# Patient Record
Sex: Female | Born: 2005 | Race: White | Hispanic: No | Marital: Single | Smoking: Never smoker
Health system: Southern US, Community
[De-identification: ages and names within clinical notes are randomized; demographics above are authoritative.]

## PROBLEM LIST (undated history)

## (undated) ENCOUNTER — Inpatient Hospital Stay (HOSPITAL_COMMUNITY): Payer: Self-pay

## (undated) ENCOUNTER — Emergency Department (HOSPITAL_COMMUNITY): Admission: EM

## (undated) DIAGNOSIS — H6690 Otitis media, unspecified, unspecified ear: Secondary | ICD-10-CM

## (undated) DIAGNOSIS — J309 Allergic rhinitis, unspecified: Secondary | ICD-10-CM

## (undated) DIAGNOSIS — L509 Urticaria, unspecified: Secondary | ICD-10-CM

## (undated) DIAGNOSIS — T783XXA Angioneurotic edema, initial encounter: Secondary | ICD-10-CM

## (undated) DIAGNOSIS — J45909 Unspecified asthma, uncomplicated: Secondary | ICD-10-CM

## (undated) HISTORY — DX: Angioneurotic edema, initial encounter: T78.3XXA

## (undated) HISTORY — DX: Urticaria, unspecified: L50.9

## (undated) HISTORY — DX: Allergic rhinitis, unspecified: J30.9

## (undated) HISTORY — DX: Unspecified asthma, uncomplicated: J45.909

## (undated) HISTORY — PX: TYMPANOSTOMY TUBE PLACEMENT: SHX32

## (undated) HISTORY — DX: Otitis media, unspecified, unspecified ear: H66.90

---
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
## 2024-01-10 ENCOUNTER — Ambulatory Visit (INDEPENDENT_AMBULATORY_CARE_PROVIDER_SITE_OTHER): Admitting: *Deleted

## 2024-01-10 DIAGNOSIS — J309 Allergic rhinitis, unspecified: Secondary | ICD-10-CM

## 2024-01-12 ENCOUNTER — Ambulatory Visit (INDEPENDENT_AMBULATORY_CARE_PROVIDER_SITE_OTHER): Admitting: Allergy and Immunology

## 2024-01-12 ENCOUNTER — Encounter: Payer: Self-pay | Admitting: Allergy and Immunology

## 2024-01-12 VITALS — BP 110/80 | HR 68 | Resp 20 | Ht 61.0 in | Wt 103.2 lb

## 2024-01-12 DIAGNOSIS — J3089 Other allergic rhinitis: Secondary | ICD-10-CM | POA: Diagnosis not present

## 2024-01-12 DIAGNOSIS — J4520 Mild intermittent asthma, uncomplicated: Secondary | ICD-10-CM | POA: Diagnosis not present

## 2024-01-12 MED ORDER — AIRSUPRA 90-80 MCG/ACT IN AERO: 2.0000 | INHALATION_SPRAY | RESPIRATORY_TRACT | 1 refills | Status: AC | PRN

## 2024-01-12 NOTE — Progress Notes (Unsigned)
 [place] - [place] - [place] - [place] - [place]   Follow-up Note  Referring Provider: Shlomo Clunes, NP Primary Provider: Shlomo Clunes, NP Date of Office Visit: 01/12/2024  Subjective:   Yvonne Richardson (DOB: 2006-02-12) is a 18 y.o. female who [facility] in re-evaluation of the following:  HPI: Erynne returns to this clinic in evaluation of asthma and allergic rhinoconjunctivitis.  I last saw her in this clinic 05 November 2022.  She is completing 5 years of immunotherapy and she has really had a good response and she went through the spring with no difficulty at all and did not need to use any antihistamines and her asthma has been nonexistent.  She can exercise without any difficulty.  She can have cold air exposure without any difficulty.  She never uses a short acting bronchodilator.  She has not required a systemic steroid or antibiotic for any type of airway issue.  Allergies as of 01/12/2024       Reactions   Latex Itching, Rash   Penicillins Rash   Has patient had a PCN reaction causing immediate rash, facial/tongue/throat swelling, SOB or lightheadedness with hypotension: Yes Has patient had a PCN reaction causing severe rash involving mucus membranes or skin necrosis: No Has patient had a PCN reaction that required hospitalization: No; was in hosp Has patient had a PCN reaction occurring within the last 10 years: No If all of the above answers are NO, then may proceed with Cephalosporin use        Medication List    EPINEPHrine  0.3 mg/0.3 mL Soaj injection Commonly known as: EPI-PEN Use as directed for life-threatening allergic reaction.   rizatriptan  10 MG disintegrating tablet Commonly known as: MAXALT -MLT TAKE 1 TABLET BY MOUTH AS NEEDED FOR MIGRAINE (TAKE 1 TABLET AT THE HEADACHE ONSET, YOU MAY REPEAT SECOND DOSE AFTER 2 HOURS BUT NO MORE THAN 2 TABLETS A DAY.).   sertraline 50 MG tablet Commonly known  as: ZOLOFT Take 50 mg by mouth daily.   topiramate  50 MG tablet Commonly known as: TOPAMAX  TAKE 1 TABLET BY MOUTH EVERYDAY AT BEDTIME     Past Medical History:  Diagnosis Date   Allergic rhinitis    Angio-edema    Asthma    past history   Otitis media    Urticaria     Past Surgical History:  Procedure Laterality Date   LAPAROSCOPIC APPENDECTOMY N/A 04/18/2019   Procedure: APPENDECTOMY LAPAROSCOPIC;  Surgeon: Chuckie Casimiro KIDD, MD;  Location: [facility] OR;  Service: Pediatrics;  Laterality: N/A;  APPENDECTOMY LAPAROSCOPIC   TYMPANOSTOMY TUBE PLACEMENT      Review of systems negative except as noted in HPI / PMHx or noted below:  Review of Systems  Constitutional: Negative.   HENT: Negative.    Eyes: Negative.   Respiratory: Negative.    Cardiovascular: Negative.   Gastrointestinal: Negative.   Genitourinary: Negative.   Musculoskeletal: Negative.   Skin: Negative.   Neurological: Negative.   Endo/Heme/Allergies: Negative.   Psychiatric/Behavioral: Negative.       Objective:   Vitals:   01/12/24 1614  BP: 110/80  Pulse: 68  Resp: 20  SpO2: 98%   Height: 5' 1 (154.9 cm)  Weight: 103 lb 3.2 oz (46.8 kg)   Physical Exam Constitutional:      Appearance: She is not diaphoretic.  HENT:     Head: Normocephalic.     Right Ear: Tympanic membrane, ear canal and external ear normal.  Left Ear: Tympanic membrane, ear canal and external ear normal.     Nose: Nose normal. No mucosal edema or rhinorrhea.     Mouth/Throat:     Pharynx: Uvula midline. No oropharyngeal exudate.  Eyes:     Conjunctiva/sclera: Conjunctivae normal.  Neck:     Thyroid: No thyromegaly.     Trachea: Trachea normal. No tracheal tenderness or tracheal deviation.  Cardiovascular:     Rate and Rhythm: Normal rate and regular rhythm.     Heart sounds: Normal heart sounds, S1 normal and S2 normal. No murmur heard. Pulmonary:     Effort: No respiratory distress.     Breath sounds: Normal breath  sounds. No stridor. No wheezing or rales.  Lymphadenopathy:     Head:     Right side of head: No tonsillar adenopathy.     Left side of head: No tonsillar adenopathy.     Cervical: No cervical adenopathy.  Skin:    Findings: No erythema or rash.     Nails: There is no clubbing.  Neurological:     Mental Status: She is alert.     Diagnostics: none  Assessment and Plan:   1. Perennial allergic rhinitis   2. Asthma, mild intermittent, well-controlled    1. Discontinue immunotherapy  2. If needed:   A. AIRSUPRA - 2 inhalations every 4-6 hours  B. OTC antihistamine - Claritin / Zyrtec / Benadryl  3.  Return to clinic in 12 months or earlier if problem  4. Influenza = Tamiflu. Covid = Paxlovid  Lynsey appears to have had a very good response to the immunotherapy and hopefully she will be in the 80% of group that can discontinue this form of treatment yet still maintain prolonged and sustained benefit.  She is going to [employer]  this August to attend college and now would be a good time to discontinue her immunotherapy.  She can use a anti-inflammatory rescue medicine should it be required and an antihistamine should it be required as noted above.  Camellia Denis, MD Allergy / Immunology [facility]

## 2024-01-12 NOTE — Patient Instructions (Signed)
  1. Discontinue immunotherapy  2. If needed:   A. AIRSUPRA - 2 inhalations every 4-6 hours  B. OTC antihistamine - Claritin / Zyrtec / Benadryl  3.  Return to clinic in 12 months or earlier if problem  4. Influenza = Tamiflu. Covid = Paxlovid

## 2024-01-13 ENCOUNTER — Encounter: Payer: Self-pay | Admitting: Allergy and Immunology

## 2024-01-31 ENCOUNTER — Ambulatory Visit: Payer: Commercial Managed Care - PPO | Admitting: Neurology

## 2024-01-31 ENCOUNTER — Encounter: Payer: Self-pay | Admitting: Neurology

## 2024-01-31 VITALS — BP 141/90 | HR 98 | Ht 61.0 in | Wt 104.2 lb

## 2024-01-31 DIAGNOSIS — G43009 Migraine without aura, not intractable, without status migrainosus: Secondary | ICD-10-CM | POA: Diagnosis not present

## 2024-01-31 MED ORDER — TOPIRAMATE 50 MG PO TABS: 50.0000 mg | ORAL_TABLET | Freq: Every evening | ORAL | 4 refills | Status: DC

## 2024-01-31 MED ORDER — RIZATRIPTAN BENZOATE 10 MG PO TBDP: ORAL_TABLET | ORAL | 11 refills | Status: AC

## 2024-01-31 NOTE — Progress Notes (Unsigned)
 [id number]    Provider:  Dr Ines Requesting Provider: Abdelmoumen, Imane, MD Primary Care Provider:  Shlomo Clunes, NP  CC:  migraines  HPI:  Yvonne Richardson is a 18 y.o. female here as requested by Jolyn Rao, MD for migraines. has Acute appendicitis, uncomplicated; Migraine without aura and without status migrainosus, not intractable; and Anxiety on their problem list.  Patient is here with her boyfriend today who provides information.  I reviewed her records from pediatric specialists Dr Jolyn last seen in May 24, 2019 for, she has a history of migraines without aura with anxiety, at last visit she reported they occurred randomly and had not required to take rizatriptan , she was tolerating topiramate  50 mg at bedtime no reported side effect from the Topamax  or the rizatriptan .  She was seen prior to that in January 2024 with her mother, she was doing well and did not have to take Maxalt  either, she reported mild headaches especially around her menstrual cycle and at that time she was taking topiramate  50 mg as well.  In July 2023 she was also on Topamax  50 mg nightly and Maxalt  with no side effects, her migraines had improved, less frequently probably 1-2 times per month or some months without migraines.  Heat and dehydration in the past triggered.  Maxalt  helps and she was usually taking Maxalt  once a month for severe migraines.  More at her menstrual cycle.She was evaluated by ophthalmology in May 2022 with normal eye exam per pediatric neurology notes.  Thorough neurologic examination has been nonfocal.  I do not see any prior imaging.  Patient is here with her boyfriend who also provides information.  She had had headaches since about 2022 or 2021, with variant frequency and severity 4 times a month, headaches are throbbing/pulsating/pounding, usually unilateral in the left parietal region can be behind the eyes but can spread to the whole head, can  radiate to the neck, mostly at the end of the day and seems to be related more frequently with her periods.  Can last more than 4 hours and be moderate to severe.  Light sensitivity sound sensitivity smell sensitivity, no aura, no medication overuse, also associated nausea and hurts to move, sleep helps, worse on her period.  Her dark room helps.  She tried over-the-counter Tylenol  Motrin  which did not help.  She has reported nausea with vomiting.  There is a strong family history of migraine in her mother.  In the past she was treated with Accutane for her acne but headaches preceded the Accutane and the Accutane did not change the severity frequency or quality of the headaches.  She had tried cyproheptadine  in the past.  Pediatric neurology started her on Topamax  with good results.  She hydrates well does not skip meals, is involved in physical activity, goes to bed at a normal time and gets 8 hours of sleep.  Doing well, 1-2 migraine days a month, mild and easily treatable with OTC analgesics, overall feels she is doing well. Ni vision change, she had her eyes checked in 2022 recommend regular eye exames. No other focal neurologic deficits, associated symptoms, inciting events or modifiable factors.    Reviewed notes, labs and imaging from outside physicians, which showed: see above  Review of Systems: Patient complains of symptoms per HPI as well as the following symptoms none. Pertinent negatives and positives per HPI. All others negative.   Social History   Socioeconomic History   Marital status: Single    Spouse  name: Not on file   Number of children: Not on file   Years of education: Not on file   Highest education level: Not on file  Occupational History   Not on file  Tobacco Use   Smoking status: Never   Smokeless tobacco: Never  Vaping Use   Vaping status: Never Used  Substance and Sexual Activity   Alcohol use: Never   Drug use: Never   Sexual activity: Never  Other Topics  Concern   Not on file  Social History Narrative   Lives at home with mom,stepdad,brother,and Transport planner    Social Drivers of Health   Financial Resource Strain: Unknown (04/18/2019)   Overall Financial Resource Strain (CARDIA)    Difficulty of Paying Living Expenses: Patient declined  Food Insecurity: Unknown (04/18/2019)   Hunger Vital Sign    Worried About Running Out of Food in the Last Year: Patient declined    Ran Out of Food in the Last Year: Patient declined  Transportation Needs: Unknown (04/18/2019)   PRAPARE - Administrator, Civil Service (Medical): Patient declined    Lack of Transportation (Non-Medical): Patient declined  Physical Activity: Unknown (04/18/2019)   Exercise Vital Sign    Days of Exercise per Week: Patient declined    Minutes of Exercise per Session: Patient declined  Stress: Unknown (04/18/2019)   [employer] of Occupational Health - Occupational Stress Questionnaire    Feeling of Stress : Patient declined  Social Connections: Unknown (04/18/2019)   Social Connection and Isolation Panel    Frequency of Communication with Friends and Family: Patient declined    Frequency of Social Gatherings with Friends and Family: Patient declined    Attends Religious Services: Patient declined    Active Member of Clubs or Organizations: Patient declined    Attends Banker Meetings: Patient declined    Marital Status: Patient declined  Intimate Partner Violence: Unknown (04/18/2019)   Humiliation, Afraid, Rape, and Kick questionnaire    Fear of Current or Ex-Partner: Patient declined    Emotionally Abused: Patient declined    Physically Abused: Patient declined    Sexually Abused: Patient declined    Family History  Problem Relation Age of Onset   Hypothyroidism Mother    Migraines Mother    Kidney disease Mother    Heart disease Mother    Hypertension Father    Celiac disease Sister    Bipolar disorder Maternal  Uncle    Allergic rhinitis Maternal Grandmother    Scoliosis Maternal Grandmother    Hernia Maternal Grandmother    Headache Neg Hx     Past Medical History:  Diagnosis Date   Allergic rhinitis    Angio-edema    Asthma    past history   Otitis media    Urticaria     Patient Active Problem List   Diagnosis Date Noted   Migraine without aura and without status migrainosus, not intractable 05/31/2023   Anxiety 05/31/2023   Acute appendicitis, uncomplicated 04/18/2019    Past Surgical History:  Procedure Laterality Date   LAPAROSCOPIC APPENDECTOMY N/A 04/18/2019   Procedure: APPENDECTOMY LAPAROSCOPIC;  Surgeon: Chuckie Casimiro KIDD, MD;  Location: [facility] OR;  Service: Pediatrics;  Laterality: N/A;  APPENDECTOMY LAPAROSCOPIC   TYMPANOSTOMY TUBE PLACEMENT      Current Outpatient Medications  Medication Sig Dispense Refill   AIRSUPRA  90-80 MCG/ACT AERO Inhale 2 puffs into the lungs as needed (every four to six hours for cough, wheeze,  shortness of breath. Rinse, gargle, and spit after use). 10.7 g 1   EPINEPHrine  0.3 mg/0.3 mL IJ SOAJ injection Use as directed for life-threatening allergic reaction. 2 each 1   sertraline (ZOLOFT) 50 MG tablet Take 50 mg by mouth daily.     rizatriptan  (MAXALT -MLT) 10 MG disintegrating tablet TAKE 1 TABLET BY MOUTH AS NEEDED FOR MIGRAINE (TAKE 1 TABLET AT THE HEADACHE ONSET, YOU MAY REPEAT SECOND DOSE AFTER 2 HOURS BUT NO MORE THAN 2 TABLETS A DAY.). 12 tablet 11   topiramate  (TOPAMAX ) 50 MG tablet Take 1 tablet (50 mg total) by mouth at bedtime. 90 tablet 4   No current facility-administered medications for this visit.    Allergies as of 01/31/2024 - Review Complete 01/31/2024  Allergen Reaction Noted   Latex Itching and Rash 05/09/2018   Penicillins Rash 05/12/2016    Vitals: BP (!) 141/90   Pulse 98   Ht 5' 1 (1.549 m)   Wt 104 lb 3.2 oz (47.3 kg)   BMI 19.69 kg/m  Last Weight:  Wt Readings from Last 1 Encounters:  01/31/24 104 lb 3.2 oz  (47.3 kg) (8%, Z= -1.38)*   * Growth percentiles are based on CDC (Girls, 2-20 Years) data.   Last Height:   Ht Readings from Last 1 Encounters:  01/31/24 5' 1 (1.549 m) (10%, Z= -1.28)*   * Growth percentiles are based on CDC (Girls, 2-20 Years) data.     Physical exam: Exam: Gen: NAD, conversant, well nourised, well groomed                     CV: RRR, no MRG. No Carotid Bruits. No peripheral edema, warm, nontender Eyes: Conjunctivae clear without exudates or hemorrhage  Neuro: Detailed Neurologic Exam  Speech:    Speech is normal; fluent and spontaneous with normal comprehension.  Cognition:    The patient is oriented to person, place, and time;     recent and remote memory intact;     language fluent;     normal attention, concentration,     fund of knowledge Cranial Nerves:    The pupils are equal, round, and reactive to light. The fundi are normal and spontaneous venous pulsations are present. Visual fields are full to finger confrontation. Extraocular movements are intact. Trigeminal sensation is intact and the muscles of mastication are normal. The face is symmetric. The palate elevates in the midline. Hearing intact. Voice is normal. Shoulder shrug is normal. The tongue has normal motion without fasciculations.   Coordination: nml  Gait: nml  Motor Observation:    No asymmetry, no atrophy, and no involuntary movements noted. Tone:    Normal muscle tone.    Posture:    Posture is normal. normal erect    Strength:    Strength is V/V in the upper and lower limbs.      Sensation: intact to LT     Reflex Exam:  DTR's:    Deep tendon reflexes in the upper and lower extremities are normal bilaterally.   Toes:    The toes are downgoing bilaterally.   Clonus:    Clonus is absent.    Assessment/Plan: Patient with episodic migraines without aura doing well on topiramate  as prevention and Maxalt  as needed.  No imaging in the past but given episodic migraines  with normal neurologic exam and improved on Topamax  we will hold off but low threshold in the future if needed.  We discussed risk of pregnancy and  teratogenicity of topiramate  I would want her to stop it prior to trying to get pregnant.  Since she is doing well we can continue the topiramate  prevention and Maxalt  as needed.  She also tried cyproheptadine  in the past.  She is on Zoloft so amitriptyline and nortriptyline are contraindicated due to risk of serotonin syndrome.  Propranolol and other beta-blockers or other blood pressure medications are contraindicated due to hypotension.  In the future I would switch her to one of the new CGRP medications if needed.  Episodic migraines without aura Plan: continue topiramate  and maxalt  In the future I would switch her to one of the new CGRP medications if needed. F/u one year   From a thorough review of records and patient report, Medications tried that can be used in migraine/headache management greater than 3 months include: Lifestyle modification, headache diaries, better sleep hygiene, exercise, management of migraine triggers, OTC and prescribed analgesics/nsaids such as ibuprofen , excedrin, alleve and others,  She also tried cyproheptadine  in the past.  She is on Zoloft so amitriptyline and nortriptyline are contraindicated due to risk of serotonin syndrome.  Propranolol and other beta-blockers or other blood pressure medications are contraindicated due to hypotension.    No orders of the defined types were placed in this encounter.  Meds ordered this encounter  Medications   rizatriptan  (MAXALT -MLT) 10 MG disintegrating tablet    Sig: TAKE 1 TABLET BY MOUTH AS NEEDED FOR MIGRAINE (TAKE 1 TABLET AT THE HEADACHE ONSET, YOU MAY REPEAT SECOND DOSE AFTER 2 HOURS BUT NO MORE THAN 2 TABLETS A DAY.).    Dispense:  12 tablet    Refill:  11   topiramate  (TOPAMAX ) 50 MG tablet    Sig: Take 1 tablet (50 mg total) by mouth at bedtime.    Dispense:  90  tablet    Refill:  4    Cc: Abdelmoumen, Imane, MD,  Shlomo Clunes, NP  Onetha Epp, MD  [facility] [address] [place], [place] [address]  Phone [phone] Fax [phone]  I spent 45 minutes of face-to-face and non-face-to-face time with patient on the  1. Migraine without aura and without status migrainosus, not intractable    diagnosis.  This included previsit chart review, lab review, study review, order entry, electronic health record documentation, patient education on the different diagnostic and therapeutic options, counseling and coordination of care, risks and benefits of management, compliance, or risk factor reduction

## 2024-02-10 ENCOUNTER — Telehealth: Payer: Self-pay | Admitting: Neurology

## 2024-02-10 NOTE — Telephone Encounter (Signed)
 Consulted with Dr Buck (Dr Ines out of office). Patient needs to stop Topamax  and Rizatriptan  and discuss with OB what they recommend at this time.

## 2024-02-10 NOTE — Telephone Encounter (Signed)
 Returned call and patient answered. She states she is [redacted] weeks pregnant. I advised her, per Dr Buck, to stop Topamax  and Rizatriptan . She confirmed she is only on 50 mg Topamax , ok to stop without weaning. Patient aware to consult with OBGYN about their recommendations for medications early in pregnancy. If needed, pt can call us  to schedule a visit afterward. Pt aware migraines may improve with pregnancy around second trimester. She thanked me for the call and did not have any further questions. Her mother was next to her during the call.

## 2024-02-10 NOTE — Telephone Encounter (Signed)
 Pt's mother called stating that pt just found out she was pregnant and they are wanting to discuss if it will be alright for the pt to continue to use the medications she is on. Please advise.

## 2024-03-08 ENCOUNTER — Encounter (HOSPITAL_COMMUNITY): Payer: Self-pay | Admitting: *Deleted

## 2024-03-08 ENCOUNTER — Other Ambulatory Visit: Payer: Self-pay

## 2024-03-08 ENCOUNTER — Inpatient Hospital Stay (HOSPITAL_COMMUNITY)
Admission: AD | Admit: 2024-03-08 | Discharge: 2024-03-08 | Disposition: A | Discharge status: Home / Self Care | Attending: Obstetrics and Gynecology | Admitting: Obstetrics and Gynecology

## 2024-03-08 DIAGNOSIS — Z3A09 9 weeks gestation of pregnancy: Secondary | ICD-10-CM | POA: Diagnosis not present

## 2024-03-08 DIAGNOSIS — R0781 Pleurodynia: Secondary | ICD-10-CM

## 2024-03-08 DIAGNOSIS — O26891 Other specified pregnancy related conditions, first trimester: Secondary | ICD-10-CM
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
## 2024-03-08 NOTE — ED Notes (Signed)
 Called MAU for pt but MAU already cleared pt. PA at bedside to talk to pt
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
## 2024-04-18 ENCOUNTER — Inpatient Hospital Stay (HOSPITAL_BASED_OUTPATIENT_CLINIC_OR_DEPARTMENT_OTHER)

## 2024-04-18 ENCOUNTER — Inpatient Hospital Stay (HOSPITAL_COMMUNITY)
Admission: AD | Admit: 2024-04-18 | Discharge: 2024-04-19 | Disposition: A | Discharge status: Home / Self Care | Attending: Obstetrics and Gynecology | Admitting: Obstetrics and Gynecology

## 2024-04-18 DIAGNOSIS — Z3A14 14 weeks gestation of pregnancy: Secondary | ICD-10-CM

## 2024-04-18 DIAGNOSIS — O219 Vomiting of pregnancy, unspecified: Secondary | ICD-10-CM | POA: Insufficient documentation

## 2024-04-18 DIAGNOSIS — O4692 Antepartum hemorrhage, unspecified, second trimester: Secondary | ICD-10-CM

## 2024-04-18 DIAGNOSIS — Z91040 Latex allergy status: Secondary | ICD-10-CM | POA: Diagnosis not present

## 2024-04-18 DIAGNOSIS — O26852 Spotting complicating pregnancy, second trimester: Secondary | ICD-10-CM | POA: Diagnosis present

## 2024-04-18 LAB — CBC WITH DIFFERENTIAL/PLATELET
Abs Immature Granulocytes: 0.03 K/uL (ref 0.00–0.07)
Basophils Absolute: 0 K/uL (ref 0.0–0.1)
Basophils Relative: 0 %
Eosinophils Absolute: 0.1 K/uL (ref 0.0–0.5)
Eosinophils Relative: 1 %
HCT: 37 % (ref 36.0–46.0)
Hemoglobin: 12.7 g/dL (ref 12.0–15.0)
Immature Granulocytes: 0 %
Lymphocytes Relative: 22 %
Lymphs Abs: 2 K/uL (ref 0.7–4.0)
MCH: 28.9 pg (ref 26.0–34.0)
MCHC: 34.3 g/dL (ref 30.0–36.0)
MCV: 84.3 fL (ref 80.0–100.0)
Monocytes Absolute: 0.6 K/uL (ref 0.1–1.0)
Monocytes Relative: 7 %
Neutro Abs: 6.4 K/uL (ref 1.7–7.7)
Neutrophils Relative %: 70 %
Platelets: 236 K/uL (ref 150–400)
RBC: 4.39 MIL/uL (ref 3.87–5.11)
RDW: 12.3 % (ref 11.5–15.5)
WBC: 9.3 K/uL (ref 4.0–10.5)
nRBC: 0 % (ref 0.0–0.2)

## 2024-04-18 LAB — BASIC METABOLIC PANEL WITH GFR
Anion gap: 8 (ref 5–15)
BUN: 6 mg/dL (ref 6–20)
CO2: 25 mmol/L (ref 22–32)
Calcium: 9 mg/dL (ref 8.9–10.3)
Chloride: 103 mmol/L (ref 98–111)
Creatinine, Ser: 0.58 mg/dL (ref 0.44–1.00)
GFR, Estimated: >60 mL/min (ref >60)
Glucose, Bld: 106 mg/dL — ABNORMAL HIGH (ref 70–99)
Potassium: 3.4 mmol/L — ABNORMAL LOW (ref 3.5–5.1)
Sodium: 136 mmol/L (ref 135–145)

## 2024-04-18 LAB — URINALYSIS, ROUTINE W REFLEX MICROSCOPIC
Bilirubin Urine: NEGATIVE
Glucose, UA: 50 mg/dL — AB
Hgb urine dipstick: NEGATIVE
Ketones, ur: 20 mg/dL — AB
Leukocytes,Ua: NEGATIVE
Nitrite: NEGATIVE
Protein, ur: NEGATIVE mg/dL
Specific Gravity, Urine: 1.019 (ref 1.005–1.030)
pH: 6 (ref 5.0–8.0)

## 2024-04-18 NOTE — MAU Note (Signed)
 Yvonne Richardson is a 18 y.o. at [redacted]w[redacted]d here in MAU reporting n/v for 3 days and unable to keep down anything. Does not have any nausea meds. Today had some bright red spotting with mild cramping  LMP: na Onset of complaint: 3 days.  Pain score: 4 Vitals:   04/18/24 2043 04/18/24 2046  BP:  128/84  Pulse: (!) 102   Resp: 17   Temp: 98.7 F (37.1 C)   SpO2: 100%      FHT: 160  Lab orders placed from triage: none

## 2024-04-18 NOTE — MAU Provider Note (Signed)
 History     CSN: [id number]  Arrival date and time: 04/18/24 2007 First Provider Initiated Contact with Patient 04/18/2024  8:46 PM  Chief Complaint  Patient presents with   Vaginal Bleeding   Emesis During Pregnancy    HPI Yvonne Richardson is a 18 y.o. G1P0 at [redacted]w[redacted]d, 10/17/2024, Date entered prior to episode creation, who presents to the M[facility] Assessment Unit for n/v x3d and new VB tonight. Pt reports n/v for 3d, tolerating liquids only. She has not had morning sickness, no known sick contacts. Tonight, she starting having some cramping and spotting. She reports that she had an early US  at her OB office and thinks that was at 4wk, c/w her LMP, and intrauterine. She was told that something was too low or too high and that she may experience some bleeding, but she doesn't not recall any other information about this.   ROS (+) VB, cramping, n/v,  (-) diarrhea, f/c,   Medications Prior to Admission  Medication Sig Dispense Refill Last Dose/Taking   AIRSUPRA  90-80 MCG/ACT AERO Inhale 2 puffs into the lungs as needed (every four to six hours for cough, wheeze, shortness of breath. Rinse, gargle, and spit after use). 10.7 g 1    EPINEPHrine  0.3 mg/0.3 mL IJ SOAJ injection Use as directed for life-threatening allergic reaction. 2 each 1    rizatriptan  (MAXALT -MLT) 10 MG disintegrating tablet TAKE 1 TABLET BY MOUTH AS NEEDED FOR MIGRAINE (TAKE 1 TABLET AT THE HEADACHE ONSET, YOU MAY REPEAT SECOND DOSE AFTER 2 HOURS BUT NO MORE THAN 2 TABLETS A DAY.). (Patient not taking: Reported on 02/10/2024) 12 tablet 11    sertraline (ZOLOFT) 50 MG tablet Take 50 mg by mouth daily.      topiramate  (TOPAMAX ) 50 MG tablet Take 1 tablet (50 mg total) by mouth at bedtime. (Patient not taking: Reported on 02/10/2024) 90 tablet 4     Past Medical History:  Diagnosis Date   Allergic rhinitis    Angio-edema    Asthma    past history   Otitis media    Urticaria     Past Surgical History:  Procedure Laterality  Date   LAPAROSCOPIC APPENDECTOMY N/A 04/18/2019   Procedure: APPENDECTOMY LAPAROSCOPIC;  Surgeon: Chuckie Casimiro KIDD, MD;  Location: [facility] OR;  Service: Pediatrics;  Laterality: N/A;  APPENDECTOMY LAPAROSCOPIC   TYMPANOSTOMY TUBE PLACEMENT       Allergies:  Allergies  Allergen Reactions   Latex Itching and Rash   Penicillins Rash    Has patient had a PCN reaction causing immediate rash, facial/tongue/throat swelling, SOB or lightheadedness with hypotension: Yes Has patient had a PCN reaction causing severe rash involving mucus membranes or skin necrosis: No Has patient had a PCN reaction that required hospitalization: No; was in hosp Has patient had a PCN reaction occurring within the last 10 years: No If all of the above answers are NO, then may proceed with Cephalosporin use    ROS reviewed and pertinent positives and negatives as documented in HPI.    Physical Exam  BP 128/84   Pulse (!) 102   Temp 98.7 F (37.1 C)   Resp 17   Ht 5' 1 (1.549 m)   Wt 48.1 kg   SpO2 100%   BMI 20.03 kg/m   Gen: alert, no acute distress CV: regular rate*** and rhythm*** Resp: nonlabored Abd: nontender, nondistended  Cervical Exam  N/A  FHT N/A  Labs     Results for orders placed or performed during the  hospital encounter of 04/18/24 (from the past 24 hours)  Urinalysis, Routine w reflex microscopic -Urine, Clean Catch     Status: Abnormal   Collection Time: 04/18/24  8:28 PM  Result Value Ref Range   Color, Urine YELLOW YELLOW   APPearance CLEAR CLEAR   Specific Gravity, Urine 1.019 1.005 - 1.030   pH 6.0 5.0 - 8.0   Glucose, UA 50 (A) NEGATIVE mg/dL   Hgb urine dipstick NEGATIVE NEGATIVE   Bilirubin Urine NEGATIVE NEGATIVE   Ketones, ur 20 (A) NEGATIVE mg/dL   Protein, ur NEGATIVE NEGATIVE mg/dL   Nitrite NEGATIVE NEGATIVE   Leukocytes,Ua NEGATIVE NEGATIVE     Imaging No results found.   Assessment and Plan  MDM Yvonne Richardson is a 18 y.o. G1P0 at [redacted]w[redacted]d, 10/17/2024,  Date entered prior to episode creation, who presents to the MAU for ***.  Ddx:  {[id number]} ***. Pt feeling *** improved after *** Viral illness, subchorionic hemorrhage, LLP/placenta previa  There are no diagnoses linked to this encounter.  Results pending at the time of DC: *** Dispo: DC home in stable condition with return precautions discussed and included in AVS.    Barabara Maier, DO FMOB Fellow, Faculty Practice [facility], [facility] for [facility]

## 2024-04-19 DIAGNOSIS — Z3A14 14 weeks gestation of pregnancy: Secondary | ICD-10-CM

## 2024-04-19 DIAGNOSIS — O26852 Spotting complicating pregnancy, second trimester: Secondary | ICD-10-CM

## 2024-04-19 MED ORDER — ONDANSETRON HCL 4 MG PO TABS: 4.0000 mg | ORAL_TABLET | Freq: Three times a day (TID) | ORAL | 0 refills | Status: AC | PRN

## 2024-04-19 NOTE — Progress Notes (Signed)
 Written and verbal d/c instructions given and pt voiced understanding.

## 2024-04-20 ENCOUNTER — Ambulatory Visit: Payer: Self-pay

## 2025-01-30 ENCOUNTER — Telehealth: Admitting: Family Medicine
